# Patient Record
Sex: Female | Born: 1935 | Race: White | Hispanic: No
Health system: Southern US, Community
[De-identification: ages and names within clinical notes are randomized; demographics above are authoritative.]

## PROBLEM LIST (undated history)

## (undated) DIAGNOSIS — E039 Hypothyroidism, unspecified: Secondary | ICD-10-CM

## (undated) DIAGNOSIS — E559 Vitamin D deficiency, unspecified: Secondary | ICD-10-CM

## (undated) DIAGNOSIS — I10 Essential (primary) hypertension: Secondary | ICD-10-CM

## (undated) DIAGNOSIS — F419 Anxiety disorder, unspecified: Secondary | ICD-10-CM

## (undated) DIAGNOSIS — M810 Age-related osteoporosis without current pathological fracture: Secondary | ICD-10-CM

## (undated) DIAGNOSIS — F0390 Unspecified dementia without behavioral disturbance: Secondary | ICD-10-CM

---

## 2018-01-30 ENCOUNTER — Emergency Department (HOSPITAL_COMMUNITY): Payer: Medicare Other

## 2018-01-30 ENCOUNTER — Emergency Department (HOSPITAL_COMMUNITY)
Admission: EM | Admit: 2018-01-30 | Discharge: 2018-01-31 | Disposition: A | Discharge status: Skilled Nursing Facility | Payer: Medicare Other | Attending: Emergency Medicine | Admitting: Emergency Medicine

## 2018-01-30 ENCOUNTER — Other Ambulatory Visit: Payer: Self-pay

## 2018-01-30 ENCOUNTER — Encounter (HOSPITAL_COMMUNITY): Payer: Self-pay

## 2018-01-30 DIAGNOSIS — R112 Nausea with vomiting, unspecified: Secondary | ICD-10-CM | POA: Insufficient documentation

## 2018-01-30 DIAGNOSIS — K5900 Constipation, unspecified: Secondary | ICD-10-CM | POA: Insufficient documentation

## 2018-01-30 DIAGNOSIS — K449 Diaphragmatic hernia without obstruction or gangrene: Secondary | ICD-10-CM | POA: Diagnosis not present

## 2018-01-30 DIAGNOSIS — R1013 Epigastric pain: Secondary | ICD-10-CM | POA: Diagnosis present

## 2018-01-30 DIAGNOSIS — I10 Essential (primary) hypertension: Secondary | ICD-10-CM | POA: Diagnosis not present

## 2018-01-30 DIAGNOSIS — Z79899 Other long term (current) drug therapy: Secondary | ICD-10-CM | POA: Insufficient documentation

## 2018-01-30 DIAGNOSIS — F0390 Unspecified dementia without behavioral disturbance: Secondary | ICD-10-CM | POA: Insufficient documentation

## 2018-01-30 DIAGNOSIS — K5641 Fecal impaction: Secondary | ICD-10-CM | POA: Insufficient documentation

## 2018-01-30 DIAGNOSIS — E039 Hypothyroidism, unspecified: Secondary | ICD-10-CM | POA: Diagnosis not present

## 2018-01-30 HISTORY — DX: Vitamin D deficiency, unspecified: E55.9

## 2018-01-30 HISTORY — DX: Hypothyroidism, unspecified: E03.9

## 2018-01-30 HISTORY — DX: Essential (primary) hypertension: I10

## 2018-01-30 HISTORY — DX: Unspecified dementia, unspecified severity, without behavioral disturbance, psychotic disturbance, mood disturbance, and anxiety: F03.90

## 2018-01-30 HISTORY — DX: Age-related osteoporosis without current pathological fracture: M81.0

## 2018-01-30 HISTORY — DX: Anxiety disorder, unspecified: F41.9

## 2018-01-30 LAB — LIPASE, BLOOD: LIPASE: 54 U/L — AB (ref 11–51)

## 2018-01-30 LAB — URINALYSIS, ROUTINE W REFLEX MICROSCOPIC
Bilirubin Urine: NEGATIVE
GLUCOSE, UA: NEGATIVE mg/dL
Hgb urine dipstick: NEGATIVE
KETONES UR: NEGATIVE mg/dL
Leukocytes, UA: NEGATIVE
Nitrite: NEGATIVE
PH: 6 (ref 5.0–8.0)
Protein, ur: NEGATIVE mg/dL
SPECIFIC GRAVITY, URINE: 1.019 (ref 1.005–1.030)

## 2018-01-30 LAB — COMPREHENSIVE METABOLIC PANEL
ALBUMIN: 3.5 g/dL (ref 3.5–5.0)
ALT: 92 U/L — ABNORMAL HIGH (ref 14–54)
AST: 172 U/L — AB (ref 15–41)
Alkaline Phosphatase: 115 U/L (ref 38–126)
Anion gap: 12 (ref 5–15)
BUN: 22 mg/dL — AB (ref 6–20)
CHLORIDE: 105 mmol/L (ref 101–111)
CO2: 27 mmol/L (ref 22–32)
Calcium: 9.2 mg/dL (ref 8.9–10.3)
Creatinine, Ser: 1.2 mg/dL — ABNORMAL HIGH (ref 0.44–1.00)
GFR calc Af Amer: 46 mL/min — ABNORMAL LOW (ref >60)
GFR calc non Af Amer: 39 mL/min — ABNORMAL LOW (ref >60)
GLUCOSE: 122 mg/dL — AB (ref 65–99)
POTASSIUM: 3.8 mmol/L (ref 3.5–5.1)
SODIUM: 144 mmol/L (ref 135–145)
Total Bilirubin: 0.6 mg/dL (ref 0.3–1.2)
Total Protein: 6.5 g/dL (ref 6.5–8.1)

## 2018-01-30 LAB — CBC
HEMATOCRIT: 40.3 % (ref 36.0–46.0)
HEMOGLOBIN: 12.8 g/dL (ref 12.0–15.0)
MCH: 29.5 pg (ref 26.0–34.0)
MCHC: 31.8 g/dL (ref 30.0–36.0)
MCV: 92.9 fL (ref 78.0–100.0)
Platelets: 303 10*3/uL (ref 150–400)
RBC: 4.34 MIL/uL (ref 3.87–5.11)
RDW: 15.1 % (ref 11.5–15.5)
WBC: 5.8 10*3/uL (ref 4.0–10.5)

## 2018-01-30 LAB — I-STAT CG4 LACTIC ACID, ED: Lactic Acid, Venous: 1.65 mmol/L (ref 0.5–1.9)

## 2018-01-30 MED ORDER — ONDANSETRON HCL 4 MG/2ML IJ SOLN
4.0000 mg | Freq: Once | INTRAMUSCULAR | Status: AC
  Administered 2018-01-30: 4 mg via INTRAVENOUS
  Filled 2018-01-30: qty 2

## 2018-01-30 MED ORDER — SODIUM CHLORIDE 0.9 % IV SOLN
INTRAVENOUS | Status: DC
  Administered 2018-01-30: 20:00:00 via INTRAVENOUS

## 2018-01-30 MED ORDER — HYDROMORPHONE HCL 1 MG/ML IJ SOLN
0.2500 mg | INTRAMUSCULAR | Status: DC | PRN
  Administered 2018-01-30: 0.25 mg via INTRAVENOUS
  Filled 2018-01-30: qty 1

## 2018-01-30 MED ORDER — POLYETHYLENE GLYCOL 3350 17 G PO PACK: 17.0000 g | PACK | Freq: Every day | ORAL | 0 refills | Status: AC

## 2018-01-30 MED ORDER — ONDANSETRON HCL 4 MG PO TABS: 4.0000 mg | ORAL_TABLET | Freq: Three times a day (TID) | ORAL | 0 refills | Status: AC | PRN

## 2018-01-30 MED ORDER — SODIUM CHLORIDE 0.9 % IV BOLUS (SEPSIS)
500.0000 mL | Freq: Once | INTRAVENOUS | Status: AC
  Administered 2018-01-30: 500 mL via INTRAVENOUS

## 2018-01-30 MED ORDER — DOCUSATE SODIUM 100 MG PO CAPS: 100.0000 mg | ORAL_CAPSULE | Freq: Two times a day (BID) | ORAL | 0 refills | Status: AC

## 2018-01-30 NOTE — Discharge Instructions (Signed)
Take the antinausea medications as needed, take the laxative medications to help with the constipation, return to the emergency room as needed for vomiting, worsening symptoms

## 2018-01-30 NOTE — ED Triage Notes (Signed)
Pt arrived from Memory Care unit via [facility]. Pt began complaining of Epigastric pain around 1800 today. Pt is AOx1. Pt is ambulatory. Pt has been spitting about every 8-10 minutes. Clear mucous.

## 2018-01-30 NOTE — ED Provider Notes (Signed)
[facility]-EMERGENCY DEPT Provider Note   CSN: [id number] Arrival date & time: 01/30/18  1849    Level 5 caveat: Dementia History   Chief Complaint Chief Complaint  Patient presents with  . Abdominal Pain    Epigastric Pain    HPI Ether GriffinsHelen Merriwether is a 84 y.o. female.  HPI Patient presents to the emergency room for evaluation of nausea vomiting and abdominal pain.  Patient is a resident of a nursing facility.  She resides in a memory care unit.  Patient began complaining of upper abdominal pain today at about 6 PM.  She started having episodes of nausea and spitting up.  EMS was called and the patient was brought to the emergency room.  She was given a dose of Zofran on route.  It is difficult to get any clear history from the patient.  She states she is hurting all over.  She is asking [facility] for help. Past Medical History:  Diagnosis Date  . Anxiety   . Dementia   . Essential (primary) hypertension   . Hypothyroid   . Hypothyroidism   . Osteoporosis   . Vitamin D deficiency     There are no active problems to display for this patient.   History reviewed. No pertinent surgical history.  OB History    No data available       Home Medications    Prior to Admission medications   Medication Sig Start Date End Date Taking? Authorizing Provider  aspirin EC 81 MG tablet Take 81 mg by mouth daily.   Yes [provider]  cholecalciferol (VITAMIN D) 1000 units tablet Take 2,000 Units by mouth daily.   Yes [provider]  guaifenesin (ROBITUSSIN) 100 MG/5ML syrup Take 100 mg by mouth 3 (three) times daily as needed for cough.   Yes [provider]  levothyroxine (SYNTHROID, LEVOTHROID) 25 MCG tablet Take 25 mcg by mouth daily before breakfast.   Yes [provider]  Melatonin 10 MG TABS Take 10 mg by mouth at bedtime.   Yes [provider]  Multiple Vitamin (MULTIVITAMIN WITH MINERALS) TABS tablet Take 1 tablet  by mouth daily.   Yes [provider]  potassium chloride SA (K-DUR,KLOR-CON) 20 MEQ tablet Take 20 mEq by mouth daily.   Yes [provider]  ranitidine (ZANTAC) 150 MG tablet Take 150 mg by mouth daily.   Yes [provider]  risperiDONE (RISPERDAL) 1 MG tablet Take 1 mg by mouth at bedtime.   Yes [provider]  sertraline (ZOLOFT) 25 MG tablet Take 25 mg by mouth daily.   Yes [provider]  docusate sodium (COLACE) 100 MG capsule Take 1 capsule (100 mg total) by mouth every 12 (twelve) hours. 01/30/18   Linwood DibblesKnapp, Bryant Lipps, MD  ondansetron (ZOFRAN) 4 MG tablet Take 1 tablet (4 mg total) by mouth every 8 (eight) hours as needed for nausea or vomiting. 01/30/18   Linwood DibblesKnapp, Cordney Barstow, MD  polyethylene glycol [facility](MIRALAX) packet Take 17 g by mouth daily. 01/30/18   Linwood DibblesKnapp, Geordan Xu, MD    Family History No family history on file.  Social History Social History   Tobacco Use  . Smoking status: Unknown If Ever Smoked  Substance Use Topics  . Alcohol use: No    Frequency: Never  . Drug use: No     Allergies   Ciprofloxacin; Iodine; and Penicillins   Review of Systems Review of Systems  All other systems reviewed and are negative.  Physical Exam Updated Vital Signs BP (!) 169/99   Pulse 95   Resp (!) 23   Ht 1.626 m (5\' 4" )   Wt 59 kg (130 lb)   SpO2 98%   BMI 22.31 kg/m   Physical Exam  Constitutional: No distress.  Elderly, frail  HENT:  Head: Normocephalic and atraumatic.  Right Ear: External ear normal.  Left Ear: External ear normal.  Eyes: Conjunctivae are normal. Right eye exhibits no discharge. Left eye exhibits no discharge. No scleral icterus.  Neck: Neck supple. No tracheal deviation present.  Cardiovascular: Normal rate, regular rhythm and intact distal pulses.  Pulmonary/Chest: Effort normal and breath sounds normal. No stridor. No respiratory distress. She has no wheezes. She has no rales.  Abdominal: Soft. Bowel sounds are  normal. She exhibits no distension, no pulsatile midline mass and no mass. There is tenderness in the epigastric area. There is no rigidity, no rebound and no guarding. No hernia.  Musculoskeletal: She exhibits no edema or tenderness.  Neurological: She is alert. She has normal strength. No cranial nerve deficit (no facial droop, extraocular movements intact, no slurred speech) or sensory deficit. She exhibits normal muscle tone. She displays no seizure activity. Coordination normal.  Skin: Skin is warm and dry. No rash noted.  Psychiatric: She has a normal mood and affect.  Nursing note and vitals reviewed.    ED Treatments / Results  Labs (all labs ordered are listed, but only abnormal results are displayed) Labs Reviewed  LIPASE, BLOOD - Abnormal; Notable for the following components:      Result Value   Lipase 54 (*)    All other components within normal limits  COMPREHENSIVE METABOLIC PANEL - Abnormal; Notable for the following components:   Glucose, Bld 122 (*)    BUN 22 (*)    Creatinine, Ser 1.20 (*)    AST 172 (*)    ALT 92 (*)    GFR calc non Af Amer 39 (*)    GFR calc Af Amer 46 (*)    All other components within normal limits  CBC  URINALYSIS, ROUTINE W REFLEX MICROSCOPIC  I-STAT CG4 LACTIC ACID, ED    EKG  EKG Interpretation  Date/Time:  Wednesday January 30 2018 19:24:40 EST Ventricular Rate:  81 PR Interval:    QRS Duration: 66 QT Interval:  392 QTC Calculation: 455 R Axis:   62 Text Interpretation:  Sinus rhythm Abnormal R-wave progression, early transition No significant change since last tracing Confirmed by Linwood Dibbles [phone]) on 01/30/2018 7:37:00 PM       Radiology Ct Abdomen Pelvis Wo Contrast  Result Date: 01/30/2018 CLINICAL DATA:  Epigastric pain since 1800 hours today. EXAM: CT ABDOMEN AND PELVIS WITHOUT CONTRAST TECHNIQUE: Multidetector CT imaging of the abdomen and pelvis was performed following the standard protocol without IV contrast.  COMPARISON:  None. FINDINGS: Lower chest: Fluid and contrast distention of the distal esophagus likely related to reflux or delayed esophageal emptying. There appears to be a paraesophageal hernia containing much of the stomach in the left lower thorax with normal position of gastroesophageal junction (series 5, image 44. Small bilateral pleural effusions right greater than left. Aortic atherosclerosis of the thoracic aorta. Coronary arteriosclerosis along the left main, lad and circumflex. Hepatobiliary: Cholecystectomy.  No acute hepatic abnormality. Pancreas: Normal Spleen: Normal Adrenals/Urinary Tract: Unremarkable bilateral adrenal glands. Subtle hypodensity in the lower poles of both kidneys may reflect small cyst but cannot be further characterized in the absence of IV contrast.  No nephrolithiasis. Nondistended slightly thick-walled urinary bladder likely due to underdistention. Correlate to exclude cystitis. Stomach/Bowel: The stomach is distended with ingested food and contrast. No small bowel dilatation or inflammation. Normal bowel rotation. No evidence of appendicitis. Large volume of stool in the rectosigmoid overall mural thickening compatible with fecal impaction and possible stercoral proctocolitis. Vascular/Lymphatic: Moderate aortoiliac and branch vessel atherosclerosis without aneurysm. No lymphadenopathy Reproductive: Status post hysterectomy. No adnexal masses. Other: No abdominopelvic ascites. Musculoskeletal: Thoracolumbar spondylosis with degenerative disc disease and vacuum disc phenomenon most pronounced at L1-2 and L4-5. Lower lumbar facet arthropathy is seen. No listhesis. IMPRESSION: 1. Herniation of the much of the stomach into the left hemithorax with normal position to gastroesophageal junction. Findings are in keeping with a large left parasagittal hernia. Fluid and contrast is noted in the distal esophagus at the from reflux or delayed esophageal emptying. 2. Small bilateral  pleural effusions. 3. Coronary arteriosclerosis and thoracic aortic atherosclerosis. 4. Large amount of retained stool in the rectosigmoid concerning for fecal impaction and possible stercoral proctocolitis. 5. Thoracolumbar spondylosis and Electronically Signed   By: Tollie Eth M.D.   On: 01/30/2018 23:34   Dg Chest Portable 1 View  Result Date: 01/30/2018 CLINICAL DATA:  84 year old female with history of abdominal pain and vomiting. EXAM: PORTABLE CHEST 1 VIEW COMPARISON:  No priors. FINDINGS: Marked elevation of the left hemidiaphragm, beneath which there is a gas containing structure. This exerts mass effect upon the heart which is largely displaced into the lower right hemithorax. Irregular opacities in lung bases may reflect areas of subsegmental atelectasis. Trace right pleural effusion. No definite acute consolidative airspace disease. No evidence of pulmonary edema. No pneumothorax. Mediastinal contours are distorted by mass effect from the process at the base of the left hemithorax. Aortic atherosclerosis. IMPRESSION: 1. Marked elevation of left hemidiaphragm, beneath which is a gas containing structure. This is likely to represent a very dilated gas-filled stomach, but could alternatively be dilated gas-filled transverse colon. Further evaluation with CT the abdomen and pelvis could provide additional information if clinically appropriate. 2. Trace right pleural effusion. 3. Aortic atherosclerosis. Electronically Signed   By: Trudie Reed M.D.   On: 01/30/2018 19:23    Procedures Procedures (including critical care time)  Medications Ordered in ED Medications  sodium chloride 0.9 % bolus 500 mL (0 mLs Intravenous Stopped 01/30/18 2045)    And  0.9 %  sodium chloride infusion ( Intravenous New Bag/Given 01/30/18 2020)  HYDROmorphone (DILAUDID) injection 0.25 mg (0.25 mg Intravenous Given 01/30/18 1951)  ondansetron (ZOFRAN) injection 4 mg (4 mg Intravenous Given 01/30/18 1951)      Initial Impression / Assessment and Plan / ED Course  I have reviewed the triage vital signs and the nursing notes.  Pertinent labs & imaging results that were available during my care of the patient were reviewed by me and considered in my medical decision making (see chart for details).   Patient's laboratory tests are notable for a slight increase in the lipase and her LFTs.  Otherwise her labs are unremarkable.  Because of her age, prior surgery and complaints CT scan was performed.  No evidence of bowel obstruction.  Patient does have a large hiatal hernia. I suspect that was the main cause of her nausea and upper abdominal pain.  Pt's daughters are here and states she has had this trouble in the past.  CT scan shows fecal impaction and possible proctocolitis.  I discussed doing a rectal exam and fecal disimpaction  with her daughters.  Pt won't allow me to do that exam.  Her demential makes it hard for her to understand and comply.  I will dc back to the nursing facility with laxative rx  Final Clinical Impressions(s) / ED Diagnoses   Final diagnoses:  Hiatal hernia  Constipation, unspecified constipation type    ED Discharge Orders        Ordered    docusate sodium (COLACE) 100 MG capsule  Every 12 hours     01/30/18 2357    polyethylene glycol (MIRALAX) packet  Daily     01/30/18 2357    ondansetron (ZOFRAN) 4 MG tablet  Every 8 hours PRN     01/30/18 2357       Linwood Dibbles, MD 01/31/18 0010

## 2018-01-31 NOTE — ED Notes (Signed)
[facility] called for transport.  

## 2018-05-11 ENCOUNTER — Encounter (HOSPITAL_COMMUNITY): Payer: Self-pay

## 2018-05-11 ENCOUNTER — Other Ambulatory Visit: Payer: Self-pay

## 2018-05-11 ENCOUNTER — Emergency Department (HOSPITAL_COMMUNITY): Payer: Medicare Other

## 2018-05-11 ENCOUNTER — Emergency Department (HOSPITAL_COMMUNITY)
Admission: EM | Admit: 2018-05-11 | Discharge: 2018-05-12 | Disposition: A | Discharge status: Skilled Nursing Facility | Payer: Medicare Other | Attending: Emergency Medicine | Admitting: Emergency Medicine

## 2018-05-11 DIAGNOSIS — R05 Cough: Secondary | ICD-10-CM | POA: Diagnosis present

## 2018-05-11 DIAGNOSIS — E039 Hypothyroidism, unspecified: Secondary | ICD-10-CM | POA: Insufficient documentation

## 2018-05-11 DIAGNOSIS — R509 Fever, unspecified: Secondary | ICD-10-CM

## 2018-05-11 DIAGNOSIS — F0390 Unspecified dementia without behavioral disturbance: Secondary | ICD-10-CM | POA: Diagnosis not present

## 2018-05-11 DIAGNOSIS — R1084 Generalized abdominal pain: Secondary | ICD-10-CM | POA: Insufficient documentation

## 2018-05-11 DIAGNOSIS — Y92129 Unspecified place in nursing home as the place of occurrence of the external cause: Secondary | ICD-10-CM | POA: Diagnosis not present

## 2018-05-11 DIAGNOSIS — J0100 Acute maxillary sinusitis, unspecified: Secondary | ICD-10-CM | POA: Insufficient documentation

## 2018-05-11 DIAGNOSIS — Y999 Unspecified external cause status: Secondary | ICD-10-CM | POA: Insufficient documentation

## 2018-05-11 DIAGNOSIS — Z79899 Other long term (current) drug therapy: Secondary | ICD-10-CM | POA: Diagnosis not present

## 2018-05-11 DIAGNOSIS — Y939 Activity, unspecified: Secondary | ICD-10-CM | POA: Diagnosis not present

## 2018-05-11 DIAGNOSIS — I10 Essential (primary) hypertension: Secondary | ICD-10-CM | POA: Diagnosis not present

## 2018-05-11 DIAGNOSIS — W19XXXA Unspecified fall, initial encounter: Secondary | ICD-10-CM | POA: Insufficient documentation

## 2018-05-11 DIAGNOSIS — Z7982 Long term (current) use of aspirin: Secondary | ICD-10-CM | POA: Insufficient documentation

## 2018-05-11 DIAGNOSIS — R059 Cough, unspecified: Secondary | ICD-10-CM

## 2018-05-11 LAB — URINALYSIS, ROUTINE W REFLEX MICROSCOPIC
BILIRUBIN URINE: NEGATIVE
Bacteria, UA: NONE SEEN
GLUCOSE, UA: NEGATIVE mg/dL
Hgb urine dipstick: NEGATIVE
KETONES UR: NEGATIVE mg/dL
NITRITE: NEGATIVE
PH: 7 (ref 5.0–8.0)
Protein, ur: NEGATIVE mg/dL
SPECIFIC GRAVITY, URINE: 1.025 (ref 1.005–1.030)

## 2018-05-11 LAB — CBC WITH DIFFERENTIAL/PLATELET
Basophils Absolute: 0 10*3/uL (ref 0.0–0.1)
Basophils Relative: 0 %
EOS ABS: 0 10*3/uL (ref 0.0–0.7)
Eosinophils Relative: 1 %
HEMATOCRIT: 36.7 % (ref 36.0–46.0)
HEMOGLOBIN: 11.9 g/dL — AB (ref 12.0–15.0)
Lymphocytes Relative: 7 %
Lymphs Abs: 0.6 10*3/uL — ABNORMAL LOW (ref 0.7–4.0)
MCH: 29.5 pg (ref 26.0–34.0)
MCHC: 32.4 g/dL (ref 30.0–36.0)
MCV: 90.8 fL (ref 78.0–100.0)
MONOS PCT: 7 %
Monocytes Absolute: 0.6 10*3/uL (ref 0.1–1.0)
NEUTROS ABS: 7.4 10*3/uL (ref 1.7–7.7)
NEUTROS PCT: 85 %
Platelets: 263 10*3/uL (ref 150–400)
RBC: 4.04 MIL/uL (ref 3.87–5.11)
RDW: 13.7 % (ref 11.5–15.5)
WBC: 8.7 10*3/uL (ref 4.0–10.5)

## 2018-05-11 LAB — COMPREHENSIVE METABOLIC PANEL
ALK PHOS: 96 U/L (ref 38–126)
ALT: 37 U/L (ref 14–54)
ANION GAP: 10 (ref 5–15)
AST: 39 U/L (ref 15–41)
Albumin: 3 g/dL — ABNORMAL LOW (ref 3.5–5.0)
BILIRUBIN TOTAL: 0.2 mg/dL — AB (ref 0.3–1.2)
BUN: 19 mg/dL (ref 6–20)
CALCIUM: 9 mg/dL (ref 8.9–10.3)
CO2: 26 mmol/L (ref 22–32)
Chloride: 106 mmol/L (ref 101–111)
Creatinine, Ser: 0.98 mg/dL (ref 0.44–1.00)
GFR calc Af Amer: 58 mL/min — ABNORMAL LOW (ref >60)
GFR, EST NON AFRICAN AMERICAN: 50 mL/min — AB (ref >60)
Glucose, Bld: 113 mg/dL — ABNORMAL HIGH (ref 65–99)
POTASSIUM: 3.7 mmol/L (ref 3.5–5.1)
Sodium: 142 mmol/L (ref 135–145)
TOTAL PROTEIN: 6.4 g/dL — AB (ref 6.5–8.1)

## 2018-05-11 LAB — I-STAT CG4 LACTIC ACID, ED: Lactic Acid, Venous: 1.29 mmol/L (ref 0.5–1.9)

## 2018-05-11 MED ORDER — DOXYCYCLINE HYCLATE 100 MG PO CAPS: 100.0000 mg | ORAL_CAPSULE | Freq: Two times a day (BID) | ORAL | 0 refills | Status: AC

## 2018-05-11 MED ORDER — DOXYCYCLINE HYCLATE 100 MG PO TABS
100.0000 mg | ORAL_TABLET | Freq: Once | ORAL | Status: DC
  Filled 2018-05-11: qty 1

## 2018-05-11 NOTE — ED Notes (Signed)
Bed: WA06 Expected date:  Expected time:  Means of arrival:  Comments: [facility] 

## 2018-05-11 NOTE — ED Notes (Signed)
Per pt dtr pt has a cough that has been getting worse and pt has complete a series of Z pack today. Pt has rhonchi noted to rt sided lung field . Pt dtr stated d.t recent infection she has had increased weakness and believes this is why pt had a fall today and rarely has falls and does not use assistive devices.

## 2018-05-11 NOTE — Discharge Instructions (Signed)
Please read and follow all provided instructions.  Your diagnoses today include:  1. Acute non-recurrent maxillary sinusitis   2. Fever, unspecified fever cause   3. Fall, initial encounter   4. Cough     Tests performed today include:  Head and neck CT - no broken bones, shows sinus infection  Blood counts and electrolytes  Urine test  Chest x-ray - does not show any definite pneumonia  Vital signs. See below for your results today.   Medications prescribed:   Doxycycline - antibiotic  You have been prescribed an antibiotic medicine: take the entire course of medicine even if you are feeling better. Stopping early can cause the antibiotic not to work.  Take any prescribed medications only as directed.  Home care instructions:  Follow any educational materials contained in this packet.  BE VERY CAREFUL not to take multiple medicines containing Tylenol (also called acetaminophen). Doing so can lead to an overdose which can damage your liver and cause liver failure and possibly death.   Follow-up instructions: Please follow-up with your primary care provider in the next 3 days for further evaluation of your symptoms.   Return instructions:   Please return to the Emergency Department if you experience worsening symptoms.   Return with worsening shortness of breath, chest pain  Please return if you have any other emergent concerns.  Additional Information:  Your vital signs today were: BP (!) 162/65    Pulse 72    Temp (!) 101.1 F (38.4 C) (Axillary)    Resp 18    Ht 4\' 11"  (1.499 m)    Wt 43.1 kg (95 lb)    SpO2 98%    BMI 19.19 kg/m  If your blood pressure (BP) was elevated above 135/85 this visit, please have this repeated by your doctor within one month. --------------

## 2018-05-11 NOTE — ED Notes (Signed)
Patient transported to X-ray 

## 2018-05-11 NOTE — ED Triage Notes (Signed)
[facility] EMS  [facility]. Pt fnd outside of SNF on the ground by staff. Pt was able to ambulate and up with minimal assistance. No obvious or apparent injuries. Pt was fnd by EMS staff sitting on chair. FS also reports that pt is being treated  being treated for SOB and "fluid on the lungs" per facility and has been on ABX. Past 3 days and has had  fever . Pt at this time appears without distress and is resting calmly no apparent respiratory distress and is able to talk in complete  sentences.   Pt has HX of  Dementia and syncope.   EMS v/s 152/64 HR 88, RR 20, 98% Temp 97.7

## 2018-05-11 NOTE — ED Provider Notes (Signed)
[facility]-EMERGENCY DEPT Provider Note   CSN: [id number] Arrival date & time: 05/11/18  1756     History   Chief Complaint Chief Complaint  Patient presents with  . Cough    HPI Kimberly Wu is a 84 y.o. female.  Patient with history of dementia presents from [facility] skilled nursing facility with complaint of fever.  Patient has had a cough for the past 3 days.  She was seen by doctors there and was placed on antibiotics which are now complete.  Patient has had on and off fevers for the past several days.  Today she was found after falling outside.  This occurred just prior to arrival.  EMS was called.  Level 5 caveat due to dementia.     Past Medical History:  Diagnosis Date  . Anxiety   . Dementia   . Essential (primary) hypertension   . Hypothyroid   . Hypothyroidism   . Osteoporosis   . Vitamin D deficiency     There are no active problems to display for this patient.   No past surgical history on file.   OB History   None      Home Medications    Prior to Admission medications   Medication Sig Start Date End Date Taking? Authorizing Provider  aspirin EC 81 MG tablet Take 81 mg by mouth daily.    [provider]  cholecalciferol (VITAMIN D) 1000 units tablet Take 2,000 Units by mouth daily.    [provider]  docusate sodium (COLACE) 100 MG capsule Take 1 capsule (100 mg total) by mouth every 12 (twelve) hours. 01/30/18   Linwood Dibbles, MD  guaifenesin (ROBITUSSIN) 100 MG/5ML syrup Take 100 mg by mouth 3 (three) times daily as needed for cough.    [provider]  levothyroxine (SYNTHROID, LEVOTHROID) 25 MCG tablet Take 25 mcg by mouth daily before breakfast.    [provider]  Melatonin 10 MG TABS Take 10 mg by mouth at bedtime.    [provider]  Multiple Vitamin (MULTIVITAMIN WITH MINERALS) TABS tablet Take 1 tablet by mouth daily.    [provider]  ondansetron (ZOFRAN) 4 MG  tablet Take 1 tablet (4 mg total) by mouth every 8 (eight) hours as needed for nausea or vomiting. 01/30/18   Linwood Dibbles, MD  polyethylene glycol [facility]) packet Take 17 g by mouth daily. 01/30/18   Linwood Dibbles, MD  potassium chloride SA (K-DUR,KLOR-CON) 20 MEQ tablet Take 20 mEq by mouth daily.    [provider]  ranitidine (ZANTAC) 150 MG tablet Take 150 mg by mouth daily.    [provider]  risperiDONE (RISPERDAL) 1 MG tablet Take 1 mg by mouth at bedtime.    [provider]  sertraline (ZOLOFT) 25 MG tablet Take 25 mg by mouth daily.    [provider]    Family History History reviewed. No pertinent family history.  Social History Social History   Tobacco Use  . Smoking status: Unknown If Ever Smoked  . Smokeless tobacco: Never Used  Substance Use Topics  . Alcohol use: No    Frequency: Never  . Drug use: No     Allergies   Ciprofloxacin; Iodine; and Penicillins   Review of Systems Review of Systems  Unable to perform ROS: Dementia     Physical Exam Updated Vital Signs Ht 4\' 11"  (1.499 m)   Wt 43.1 kg (95 lb)   BMI 19.19 kg/m   Physical  Exam  Constitutional: She appears well-developed and well-nourished.  HENT:  Head: Normocephalic and atraumatic.  Mouth/Throat: Oropharynx is clear and moist.  Eyes: Conjunctivae are normal. Right eye exhibits no discharge. Left eye exhibits no discharge.  Neck: Normal range of motion. Neck supple.  Cardiovascular: Normal rate and regular rhythm.  No murmur heard. Pulmonary/Chest: Effort normal and breath sounds normal. No respiratory distress. She has no wheezes. She has no rales.  Abdominal: Soft. There is tenderness (Generalized). There is no rebound and no guarding.  Musculoskeletal: She exhibits no edema or tenderness.  Neurological: She is alert.  Skin: Skin is warm and dry.  Warm to touch, consistent with fever  Psychiatric: She has a normal mood and affect.  Nursing note and  vitals reviewed.    ED Treatments / Results  Labs (all labs ordered are listed, but only abnormal results are displayed) Labs Reviewed  CBC WITH DIFFERENTIAL/PLATELET - Abnormal; Notable for the following components:      Result Value   Hemoglobin 11.9 (*)    Lymphs Abs 0.6 (*)    All other components within normal limits  URINALYSIS, ROUTINE W REFLEX MICROSCOPIC - Abnormal; Notable for the following components:   APPearance CLOUDY (*)    Leukocytes, UA TRACE (*)    All other components within normal limits  COMPREHENSIVE METABOLIC PANEL - Abnormal; Notable for the following components:   Glucose, Bld 113 (*)    Total Protein 6.4 (*)    Albumin 3.0 (*)    Total Bilirubin 0.2 (*)    GFR calc non Af Amer 50 (*)    GFR calc Af Amer 58 (*)    All other components within normal limits  CULTURE, BLOOD (ROUTINE X 2)  CULTURE, BLOOD (ROUTINE X 2)  I-STAT CG4 LACTIC ACID, ED    EKG EKG Interpretation  Date/Time:  Saturday May 11 2018 19:28:59 EDT Ventricular Rate:  73 PR Interval:    QRS Duration: 78 QT Interval:  394 QTC Calculation: 435 R Axis:   66 Text Interpretation:  Sinus rhythm Nonspecific ST abnormality Confirmed by Cathren Laine ([id number]) on 05/11/2018 7:33:06 PM   Radiology Dg Chest 2 View  Result Date: 05/11/2018 CLINICAL DATA:  Cough for several days EXAM: CHEST - 2 VIEW COMPARISON:  01/30/2018 FINDINGS: Cardiac shadow is mildly enlarged but stable. Elevation of left hemidiaphragm is again seen. Mild right basilar atelectasis is noted. Mild vascular congestion is seen. No bony abnormality is noted. IMPRESSION: Mild vascular congestion and right basilar atelectasis. Electronically Signed   By: Alcide Clever M.D.   On: 05/11/2018 21:01   Ct Head Wo Contrast  Result Date: 05/11/2018 CLINICAL DATA:  84 year old nursing home patient who was found on the ground but responsive at the nursing home earlier today. EXAM: CT HEAD WITHOUT CONTRAST CT CERVICAL SPINE WITHOUT CONTRAST  TECHNIQUE: Multidetector CT imaging of the head and cervical spine was performed following the standard protocol without intravenous contrast. Multiplanar CT image reconstructions of the cervical spine were also generated. COMPARISON:  None. FINDINGS: CT HEAD FINDINGS Brain: Severe cortical and deep atrophy and mild cerebellar atrophy. Severe changes of small vessel disease of the white matter diffusely. No mass lesion. No midline shift. No acute hemorrhage or hematoma. No extra-axial fluid collections. No evidence of acute infarction. Vascular: Severe BILATERAL carotid siphon and vertebral artery atherosclerosis. Skull: No skull fracture or other focal osseous abnormality involving the skull. Sinuses/Orbits: Opacification of BILATERAL ethmoid air cells with scattered air-fluid levels. Air-fluid levels in the  maxillary sinuses. Frontal sinuses and sphenoid sinuses well aerated. BILATERAL mastoid air cells and BILATERAL middle ear cavities well-aerated. Visualized orbits and globes normal in appearance. Other: None. CT CERVICAL SPINE FINDINGS Alignment: Anatomic POSTERIOR alignment. Straightening of the usual lordosis. Skull base and vertebrae: No fractures identified involving the cervical spine. Coronal reformatted images demonstrate an intact craniocervical junction, intact dens and intact lateral masses throughout. Soft tissues and spinal canal: No evidence of paraspinous or spinal canal hematoma. No evidence of spinal stenosis. Disc levels: Moderate disc space narrowing at C5-6 and C6-7. Mild disc space narrowing at C4-5. No visible disc protrusions. Predominantly facet hypertrophy accounts for multilevel foraminal stenoses including mild LEFT C2-3, severe LEFT C3-4, moderate RIGHT C4-5, severe RIGHT C5-6 and severe BILATERAL C6-7. Upper chest: Visualized lung apices clear. Visualized superior mediastinum normal. Other: Atrophic thyroid gland with an approximate 1.0 cm nodule in the LEFT lobe. BILATERAL cervical  carotid atherosclerosis. IMPRESSION: CT Head: 1. No acute intracranial abnormality. 2. Severe generalized atrophy and severe chronic microvascular ischemic changes of the white matter. 3. Acute BILATERAL ethmoid and BILATERAL maxillary sinus disease. CT Cervical Spine: 1. No cervical spine fracture is identified. 2. Degenerative disc disease at C4-5, C5-6 and C6-7. Multilevel foraminal stenoses as detailed above. 3. Atrophic thyroid gland with an approximate 1.0 cm LEFT lobe thyroid nodule. This nodule does not require further imaging follow-up. This recommendation follows consensus guidelines. Electronically Signed   By: Hulan Saashomas  Lawrence M.D.   On: 05/11/2018 19:36   Ct Cervical Spine Wo Contrast  Result Date: 05/11/2018 CLINICAL DATA:  84 year old nursing home patient who was found on the ground but responsive at the nursing home earlier today. EXAM: CT HEAD WITHOUT CONTRAST CT CERVICAL SPINE WITHOUT CONTRAST TECHNIQUE: Multidetector CT imaging of the head and cervical spine was performed following the standard protocol without intravenous contrast. Multiplanar CT image reconstructions of the cervical spine were also generated. COMPARISON:  None. FINDINGS: CT HEAD FINDINGS Brain: Severe cortical and deep atrophy and mild cerebellar atrophy. Severe changes of small vessel disease of the white matter diffusely. No mass lesion. No midline shift. No acute hemorrhage or hematoma. No extra-axial fluid collections. No evidence of acute infarction. Vascular: Severe BILATERAL carotid siphon and vertebral artery atherosclerosis. Skull: No skull fracture or other focal osseous abnormality involving the skull. Sinuses/Orbits: Opacification of BILATERAL ethmoid air cells with scattered air-fluid levels. Air-fluid levels in the maxillary sinuses. Frontal sinuses and sphenoid sinuses well aerated. BILATERAL mastoid air cells and BILATERAL middle ear cavities well-aerated. Visualized orbits and globes normal in appearance.  Other: None. CT CERVICAL SPINE FINDINGS Alignment: Anatomic POSTERIOR alignment. Straightening of the usual lordosis. Skull base and vertebrae: No fractures identified involving the cervical spine. Coronal reformatted images demonstrate an intact craniocervical junction, intact dens and intact lateral masses throughout. Soft tissues and spinal canal: No evidence of paraspinous or spinal canal hematoma. No evidence of spinal stenosis. Disc levels: Moderate disc space narrowing at C5-6 and C6-7. Mild disc space narrowing at C4-5. No visible disc protrusions. Predominantly facet hypertrophy accounts for multilevel foraminal stenoses including mild LEFT C2-3, severe LEFT C3-4, moderate RIGHT C4-5, severe RIGHT C5-6 and severe BILATERAL C6-7. Upper chest: Visualized lung apices clear. Visualized superior mediastinum normal. Other: Atrophic thyroid gland with an approximate 1.0 cm nodule in the LEFT lobe. BILATERAL cervical carotid atherosclerosis. IMPRESSION: CT Head: 1. No acute intracranial abnormality. 2. Severe generalized atrophy and severe chronic microvascular ischemic changes of the white matter. 3. Acute BILATERAL ethmoid and BILATERAL maxillary sinus  disease. CT Cervical Spine: 1. No cervical spine fracture is identified. 2. Degenerative disc disease at C4-5, C5-6 and C6-7. Multilevel foraminal stenoses as detailed above. 3. Atrophic thyroid gland with an approximate 1.0 cm LEFT lobe thyroid nodule. This nodule does not require further imaging follow-up. This recommendation follows consensus guidelines. Electronically Signed   By: Hulan Saas M.D.   On: 05/11/2018 19:36    Procedures Procedures (including critical care time)  Medications Ordered in ED Medications  doxycycline (VIBRA-TABS) tablet 100 mg (100 mg Oral Not Given 05/11/18 2352)     Initial Impression / Assessment and Plan / ED Course  I have reviewed the triage vital signs and the nursing notes.  Pertinent labs & imaging results  that were available during my care of the patient were reviewed by me and considered in my medical decision making (see chart for details).     Patient seen and examined in hallway. Work-up initiated. Medications ordered. Low concern for head or neck injury however given fall and patient's history of dementia, will image head and neck.  Vital signs reviewed and are as follows: BP (!) 162/65   Pulse 72   Temp 98 F (36.7 C) (Axillary)   Resp 18   Ht 4\' 11"  (1.499 m)   Wt 43.1 kg (95 lb)   SpO2 98%   BMI 19.19 kg/m   Patient discussed with and seen by Dr. Denton Lank.   Lab work-up is reassuring.  Normal lactate.  Normal white count.  Blood cultures drawn and pending.  Chest x-ray does not demonstrate pneumonia.  Patient ordered doxycycline for sinusitis demonstrated on CT imaging.  Urine returned.  Patient does not have urinary symptoms and urine does not show overwhelming infection.  Suspect that given cough, upper respiratory tract infection/sinusitis is most likely.  Family and patient updated on plan.  Will discharge back to her facility.  Encourage PCP follow-up for recheck in the next week.  Return with worsening symptoms, persistent fevers, vomiting, new symptoms or other concerns.  Final Clinical Impressions(s) / ED Diagnoses   Final diagnoses:  Acute non-recurrent maxillary sinusitis  Fever, unspecified fever cause  Fall, initial encounter  Cough   Patient with fever and fall today.  She currently resides in a memory care facility.  Work-up is significant for acute sinusitis.  Patient has cough but chest x-ray is clear.  There are some white cells in the urine but no bacteria and no nitrites.  I have low suspicion for UTI at this time.  Normal white blood cell count with normal lactic acid.  Patient appears well, nontoxic.  She is at her mental baseline.  We will discharged home on doxycycline for sinusitis.  Return instructions as above.   ED Discharge Orders        Ordered      doxycycline (VIBRAMYCIN) 100 MG capsule  2 times daily     05/11/18 2325       Renne Crigler, PA-C 05/12/18 0009    Cathren Laine, MD 05/12/18 [phone]

## 2018-05-11 NOTE — ED Notes (Signed)
Bed: [facility] Expected date:  Expected time:  Means of arrival:  Comments: EMS-fall 

## 2018-05-12 NOTE — ED Notes (Signed)
[facility] to transport pt back to nursing home.

## 2018-05-16 LAB — CULTURE, BLOOD (ROUTINE X 2)
CULTURE: NO GROWTH
Culture: NO GROWTH
SPECIAL REQUESTS: ADEQUATE
SPECIAL REQUESTS: ADEQUATE

## 2018-07-07 ENCOUNTER — Emergency Department (HOSPITAL_COMMUNITY): Payer: Medicare Other

## 2018-07-07 ENCOUNTER — Encounter (HOSPITAL_COMMUNITY): Payer: Self-pay | Admitting: *Deleted

## 2018-07-07 ENCOUNTER — Emergency Department (HOSPITAL_COMMUNITY)
Admission: EM | Admit: 2018-07-07 | Discharge: 2018-07-07 | Disposition: A | Discharge status: Skilled Nursing Facility | Payer: Medicare Other | Attending: Emergency Medicine | Admitting: Emergency Medicine

## 2018-07-07 DIAGNOSIS — F0390 Unspecified dementia without behavioral disturbance: Secondary | ICD-10-CM | POA: Diagnosis not present

## 2018-07-07 DIAGNOSIS — Y939 Activity, unspecified: Secondary | ICD-10-CM | POA: Diagnosis not present

## 2018-07-07 DIAGNOSIS — E039 Hypothyroidism, unspecified: Secondary | ICD-10-CM | POA: Diagnosis not present

## 2018-07-07 DIAGNOSIS — Y92126 Garden or yard of nursing home as the place of occurrence of the external cause: Secondary | ICD-10-CM | POA: Insufficient documentation

## 2018-07-07 DIAGNOSIS — W101XXA Fall (on)(from) sidewalk curb, initial encounter: Secondary | ICD-10-CM | POA: Diagnosis not present

## 2018-07-07 DIAGNOSIS — Y999 Unspecified external cause status: Secondary | ICD-10-CM | POA: Diagnosis not present

## 2018-07-07 DIAGNOSIS — M542 Cervicalgia: Secondary | ICD-10-CM | POA: Insufficient documentation

## 2018-07-07 DIAGNOSIS — R51 Headache: Secondary | ICD-10-CM | POA: Diagnosis not present

## 2018-07-07 DIAGNOSIS — Z79899 Other long term (current) drug therapy: Secondary | ICD-10-CM | POA: Diagnosis not present

## 2018-07-07 DIAGNOSIS — Z7982 Long term (current) use of aspirin: Secondary | ICD-10-CM | POA: Insufficient documentation

## 2018-07-07 DIAGNOSIS — I10 Essential (primary) hypertension: Secondary | ICD-10-CM | POA: Insufficient documentation

## 2018-07-07 NOTE — ED Notes (Signed)
Bed: [id number] Expected date: 07/07/18 Expected time: 12:26 PM Means of arrival: Ambulance Comments: Fall, form Memory Care unit

## 2018-07-07 NOTE — ED Notes (Signed)
[facility] called  

## 2018-07-07 NOTE — Discharge Instructions (Addendum)
Please follow-up with your primary care provider regarding your visit today.  Your blood pressure was elevated today, be sure to follow-up with your primary care provider regarding this as well. Please return for any new or worsening symptoms.  Get help right away if: You have: A very bad (severe) headache that is not helped by medicine. Trouble walking or weakness in your arms and legs. Clear or bloody fluid coming from your nose or ears. Changes in your seeing (vision). Jerky movements that you cannot control (seizure). You throw up (vomit). Your symptoms get worse. You lose balance. Your speech is slurred. You pass out. You are sleepier and have trouble staying awake. The black centers of your eyes (pupils) change in size.

## 2018-07-07 NOTE — ED Triage Notes (Signed)
Per EMS, pt from [facility] memory care unit had unwitnessed fall today. Pt was found on atrium floor at ~1130 today. Pt is at baseline mental status according to staff, has hx of alzheimer's. Pt has hematoma to left scalp. Pt states she has some neck pain.

## 2018-07-07 NOTE — ED Provider Notes (Signed)
  Face-to-face evaluation   History: Patient had an unwitnessed fall today was found on floor in the common area.  She lives at a memory care unit.  Daughter is with her at this time.  Physical exam: Patient is alert and responsive she is confused.  Head without palpable or visible injury.  Left hip slightly tender laterally, left leg slightly shortened.  I am able to passively range the left hip and in normal fashion without significant pain.  There is a mobile mass palpable over the left greater trochanter which has the consistency of a lipoma.  There is no bruising overlying this area of swelling.   Medical screening examination/treatment/procedure(s) were conducted as a shared visit with non-physician practitioner(s) and myself.  I personally evaluated the patient during the encounter    Mancel BaleWentz, Minahil Quinlivan, MD 07/10/18 1149

## 2018-07-07 NOTE — ED Provider Notes (Addendum)
[facility]-EMERGENCY DEPT Provider Note   CSN: [id number] Arrival date & time: 07/07/18  1227     History   Chief Complaint Chief Complaint  Patient presents with  . Fall    HPI [name] is a 84 y.o. female presenting via EMS from [facility] for fall approximately 1 hour prior to arrival.  Patient fell in the Courtyard onto uneven cobblestones, this was unwitnessed by staff.  Patient is back to baseline per family at bedside.  Patient endorses left sided headache and neck pain at this time.  Level 5 caveat due to dementia.  HPI  Past Medical History:  Diagnosis Date  . Anxiety   . Dementia   . Essential (primary) hypertension   . Hypothyroid   . Hypothyroidism   . Osteoporosis   . Vitamin D deficiency     There are no active problems to display for this patient.   History reviewed. No pertinent surgical history.   OB History   None      Home Medications    Prior to Admission medications   Medication Sig Start Date End Date Taking? Authorizing Provider  acetaminophen (TYLENOL) 325 MG tablet Take 650 mg by mouth every 6 (six) hours as needed for fever.    [provider]  aspirin EC 81 MG tablet Take 81 mg by mouth daily.    [provider]  Cholecalciferol (VITAMIN D) 2000 units tablet Take 2,000 Units by mouth daily.     [provider]  docusate sodium (COLACE) 100 MG capsule Take 1 capsule (100 mg total) by mouth every 12 (twelve) hours. 01/30/18   Linwood DibblesKnapp, Jon, MD  doxycycline (VIBRAMYCIN) 100 MG capsule Take 1 capsule (100 mg total) by mouth 2 (two) times daily. Patient not taking: Reported on 07/07/2018 05/11/18   Renne CriglerGeiple, Joshua, PA-C  guaifenesin (ROBITUSSIN) 100 MG/5ML syrup Take 100 mg by mouth 3 (three) times daily as needed for cough.    [provider]  hydrocortisone 2.5 % cream Apply topically 2 (two) times daily. Apply to rectal area for hemorrhoids 07/06/18 08/05/18  [provider]  levothyroxine (SYNTHROID, LEVOTHROID) 25 MCG tablet Take 25 mcg by mouth daily before breakfast.    [provider]  loperamide (IMODIUM A-D) 2 MG tablet Take 2 mg by mouth daily as needed for diarrhea or loose stools.    [provider]  LORazepam (ATIVAN) 2 MG/ML concentrated solution 2 mg every 12 (twelve) hours as needed (aggression). Apply to wrist topically    [provider]  Melatonin 1 MG TABS Take 1 mg by mouth at bedtime.     [provider]  ondansetron (ZOFRAN) 4 MG tablet Take 1 tablet (4 mg total) by mouth every 8 (eight) hours as needed for nausea or vomiting. 01/30/18   Linwood DibblesKnapp, Jon, MD  polyethylene glycol [facility](MIRALAX) packet Take 17 g by mouth daily. 01/30/18   Linwood DibblesKnapp, Jon, MD  ranitidine (ZANTAC) 300 MG tablet Take 300 mg by mouth daily.     [provider]  risperiDONE (RISPERDAL) 1 MG tablet Take 0.5 mg by mouth at bedtime.     [provider]  sertraline (ZOLOFT) 25 MG tablet Take 25 mg by mouth daily.    [provider]    Family History No family history on file.  Social History Social History   Tobacco Use  . Smoking status: Unknown If Ever Smoked  . Smokeless tobacco: Never Used  Substance Use Topics  .  Alcohol use: No    Frequency: Never  . Drug use: No     Allergies   Penicillins; Ciprofloxacin; Iodine; and Sulfa antibiotics   Review of Systems Review of Systems  Unable to perform ROS: Dementia     Physical Exam Updated Vital Signs BP (!) 154/80 (BP Location: Right Arm)   Pulse (!) 58   Temp 98.2 F (36.8 C) (Oral)   Resp 18   SpO2 100%   Physical Exam  Constitutional: She is oriented to person, place, and time. She appears well-developed and well-nourished. No distress.  HENT:  Head: Normocephalic and atraumatic. Head is without raccoon's eyes, without Battle's sign, without abrasion and without contusion.  Right Ear: External ear normal.  Left Ear: External ear  normal.  Nose: Nose normal.  Mouth/Throat: Uvula is midline, oropharynx is clear and moist and mucous membranes are normal.  I have not been able to locate a hematoma on the patient's scalp or any other signs of injury, perhaps note from nursing staff is referring to the patient's large seborrheic keratosis on her left scalp.  Eyes: Pupils are equal, round, and reactive to light. EOM are normal.  Neck: Trachea normal, normal range of motion, full passive range of motion without pain and phonation normal. Neck supple. No JVD present. No tracheal tenderness and no spinous process tenderness present. No neck rigidity. No tracheal deviation present.  Cardiovascular: Normal rate, regular rhythm, normal heart sounds and intact distal pulses.  Pulmonary/Chest: Effort normal and breath sounds normal. No respiratory distress. She exhibits no tenderness, no crepitus, no deformity and no swelling.  Abdominal: Soft. Normal appearance and bowel sounds are normal. There is no tenderness. There is no rebound and no guarding.  Musculoskeletal: Normal range of motion.       Left hip: She exhibits normal range of motion and normal strength.       Cervical back: Normal. She exhibits normal range of motion, no tenderness, no bony tenderness, no swelling and no deformity.       Thoracic back: Normal. She exhibits normal range of motion, no tenderness, no bony tenderness, no swelling and no deformity.       Lumbar back: Normal. She exhibits normal range of motion, no tenderness, no bony tenderness, no swelling and no deformity.       Legs: Patient is moving all extremities well and without obvious distress.  Patient is able to sit up in the bed and lift her legs independently, cross and uncrosses her legs without obvious distress.  Neurological: She is alert and oriented to person, place, and time. No sensory deficit.  Skin: Skin is warm and dry. Capillary refill takes less than 2 seconds.  Psychiatric: She has a normal  mood and affect. Her behavior is normal.     ED Treatments / Results  Labs (all labs ordered are listed, but only abnormal results are displayed) Labs Reviewed - No data to display  EKG None  Radiology Ct Head Wo Contrast  Result Date: 07/07/2018 CLINICAL DATA:  Found on floor. Alzheimer's disease with patient at baseline. Left scalp hematoma EXAM: CT HEAD WITHOUT CONTRAST CT CERVICAL SPINE WITHOUT CONTRAST TECHNIQUE: Multidetector CT imaging of the head and cervical spine was performed following the standard protocol without intravenous contrast. Multiplanar CT image reconstructions of the cervical spine were also generated. COMPARISON:  05/28/2018 FINDINGS: CT HEAD FINDINGS Brain: No evidence of acute infarction, hemorrhage, hydrocephalus, extra-axial collection or mass lesion/mass effect. Confluent low-density in the cerebral white matter  from chronic small vessel ischemia. Generalized atrophy. Vascular: Atherosclerotic calcification Skull: Negative for fracture. Possible left parietal scalp swelling. Sinuses/Orbits: Bilateral cataract resection. CT CERVICAL SPINE FINDINGS Alignment: Normal Skull base and vertebrae: Negative for fracture Soft tissues and spinal canal: No prevertebral fluid or swelling. No visible canal hematoma. Disc levels:  Usual degenerative changes Upper chest: No visible injury IMPRESSION: No evidence of acute intracranial or cervical spine injury. Electronically Signed   By: Marnee Spring M.D.   On: 07/07/2018 14:25   Ct Cervical Spine Wo Contrast  Result Date: 07/07/2018 CLINICAL DATA:  Found on floor. Alzheimer's disease with patient at baseline. Left scalp hematoma EXAM: CT HEAD WITHOUT CONTRAST CT CERVICAL SPINE WITHOUT CONTRAST TECHNIQUE: Multidetector CT imaging of the head and cervical spine was performed following the standard protocol without intravenous contrast. Multiplanar CT image reconstructions of the cervical spine were also generated. COMPARISON:   05/28/2018 FINDINGS: CT HEAD FINDINGS Brain: No evidence of acute infarction, hemorrhage, hydrocephalus, extra-axial collection or mass lesion/mass effect. Confluent low-density in the cerebral white matter from chronic small vessel ischemia. Generalized atrophy. Vascular: Atherosclerotic calcification Skull: Negative for fracture. Possible left parietal scalp swelling. Sinuses/Orbits: Bilateral cataract resection. CT CERVICAL SPINE FINDINGS Alignment: Normal Skull base and vertebrae: Negative for fracture Soft tissues and spinal canal: No prevertebral fluid or swelling. No visible canal hematoma. Disc levels:  Usual degenerative changes Upper chest: No visible injury IMPRESSION: No evidence of acute intracranial or cervical spine injury. Electronically Signed   By: Marnee Spring M.D.   On: 07/07/2018 14:25   Dg Hip Unilat W Or Wo Pelvis 2-3 Views Left  Result Date: 07/07/2018 CLINICAL DATA:  Unwitnessed fall today with LEFT hip deformity. EXAM: DG HIP (WITH OR WITHOUT PELVIS) 2-3V LEFT COMPARISON:  None. FINDINGS: No acute fracture, subluxation or dislocation. No focal bony lesions identified. Degenerative changes in the LOWER lumbar spine noted. IMPRESSION: No acute bony abnormality. Electronically Signed   By: Harmon Pier M.D.   On: 07/07/2018 16:41    Procedures Procedures (including critical care time)  Medications Ordered in ED Medications - No data to display   Initial Impression / Assessment and Plan / ED Course  I have reviewed the triage vital signs and the nursing notes.  Pertinent labs & imaging results that were available during my care of the patient were reviewed by me and considered in my medical decision making (see chart for details).    Elderly patient with dementia. Fall at nursing home. At baseline mental status per family member at bedside. No signs of injury on PE. No blood thinners. Negative imaging at this time, CT head and cervical spine negative for acute  abnormalities.  Plain films of left hip were taken due to deformity in that area initially thought to be a dislocation.  Imaging negative for acute bony abnormality.  Evaluated by Dr. Effie Shy, believe that this was due to patient positioning, patient is moving all extremities well, able to cross and across her legs and lift each leg up without obvious sign of distress.  Patient is moving all other extremities well and without obvious signs of distress. Patient appears safe to return back to her skilled nursing facility at this time.  Patient's family is agreeable to plan.  At this time there does not appear to be any evidence of an acute emergency medical condition and the patient appears stable for discharge with appropriate outpatient follow up. Diagnosis was discussed with patient who verbalizes understanding and is agreeable to discharge.  I have discussed return precautions with patient's family who verbalize understanding of return precautions. Patient strongly encouraged to follow-up with their PCP.  Patient's case discussed with Dr. Effie Shy who agrees with plan to discharge with follow-up.   [facility] called by nursing staff to return patient to her facility.  This note was dictated using DragonOne dictation software; please contact for any inconsistencies within the note.    Final Clinical Impressions(s) / ED Diagnoses   Final diagnoses:  Fall (on)(from) sidewalk curb, initial encounter    ED Discharge Orders    None       Elizabeth Palau 07/07/18 1658    Bill Salinas, PA-C 07/07/18 1700    Elizabeth Palau 07/07/18 1701    Mancel Bale, MD 07/10/18 1148

## 2018-10-27 ENCOUNTER — Telehealth (HOSPITAL_BASED_OUTPATIENT_CLINIC_OR_DEPARTMENT_OTHER): Payer: Self-pay | Admitting: Emergency Medicine

## 2019-01-11 DEATH — deceased

## 2019-04-26 IMAGING — CT CT CERVICAL SPINE W/O CM
4 of 7 series · 14 of 33 positions shown, 15 images · non-contrast
Comparison: 05/28/2018

CLINICAL DATA: Found on floor. Alzheimer's disease with patient at
baseline. Left scalp hematoma

EXAM:
CT HEAD WITHOUT CONTRAST
CT CERVICAL SPINE WITHOUT CONTRAST
TECHNIQUE: Multidetector CT imaging of the head and cervical spine was
performed following the standard protocol without intravenous
contrast. Multiplanar CT image reconstructions of the cervical spine
were also generated.

[Series 5: coronal soft tissue · coronal · 0.29mm/px · 3 of 70 slices shown]
[im 18/70  bone]
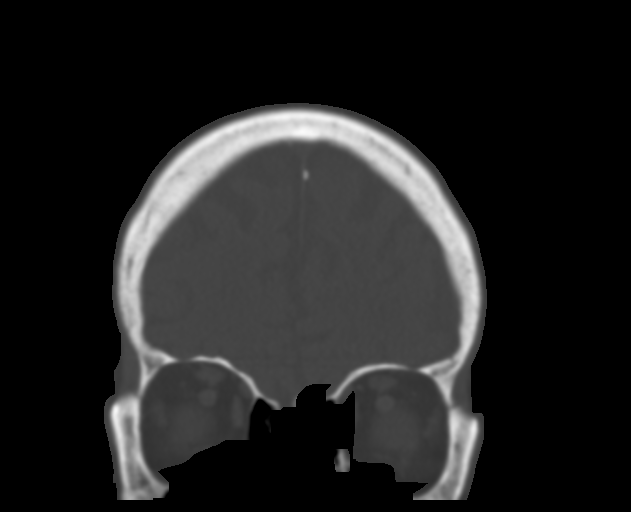
[im 35/70  bone]
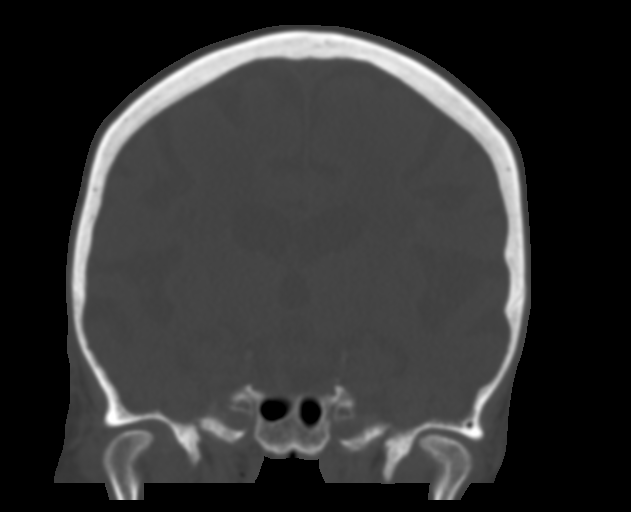
[im 52/70  bone]
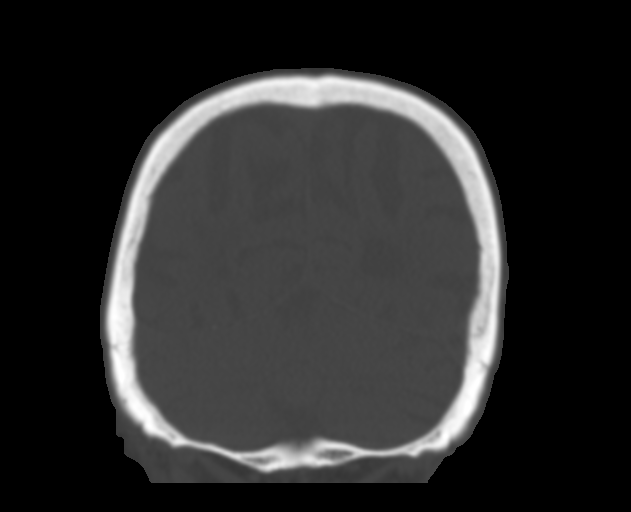

[Series 8: c spine soft · axial · 0.24mm/px · z∈[-232,-198]mm · 2 of 87 slices shown]
[im 18/87  soft-tissue]
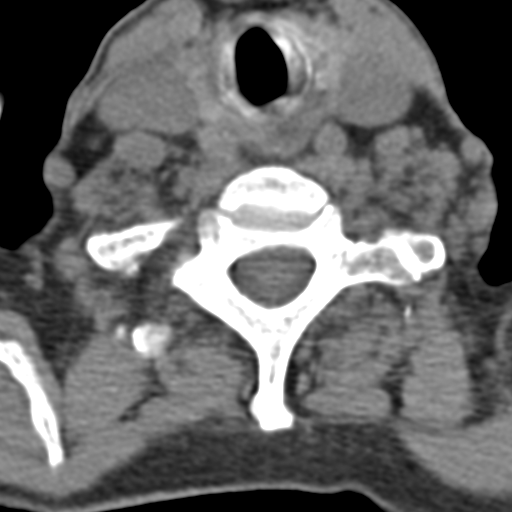
[im 35/87  soft-tissue]
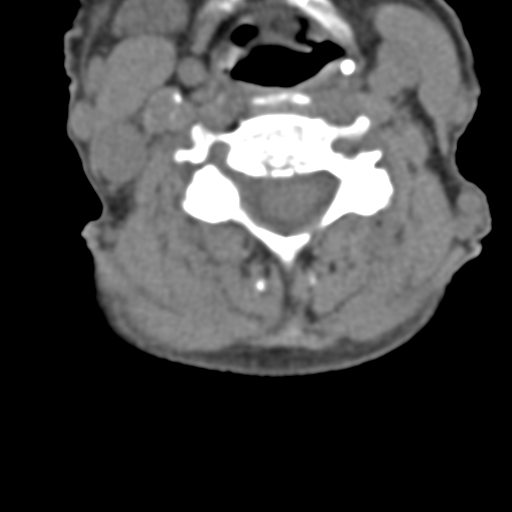

[Series 10: orthogonal bone · axial · 0.18mm/px · z∈[-226,-133]mm · 4 of 84 slices shown, 5 images]
[im 17/84  soft-tissue]
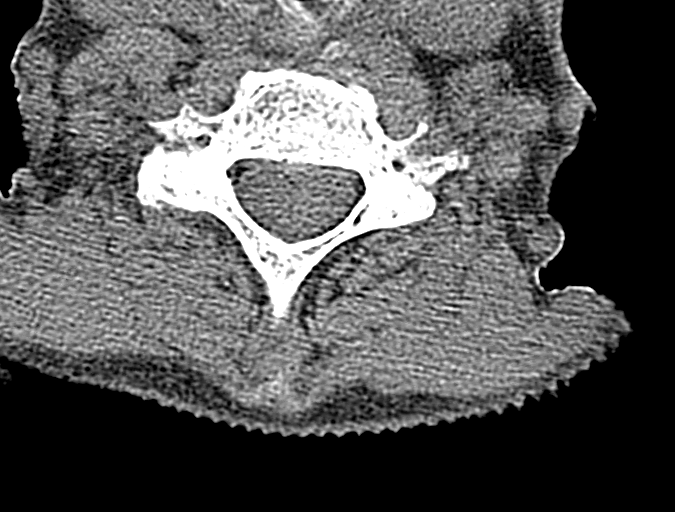
[im 17/84  bone]
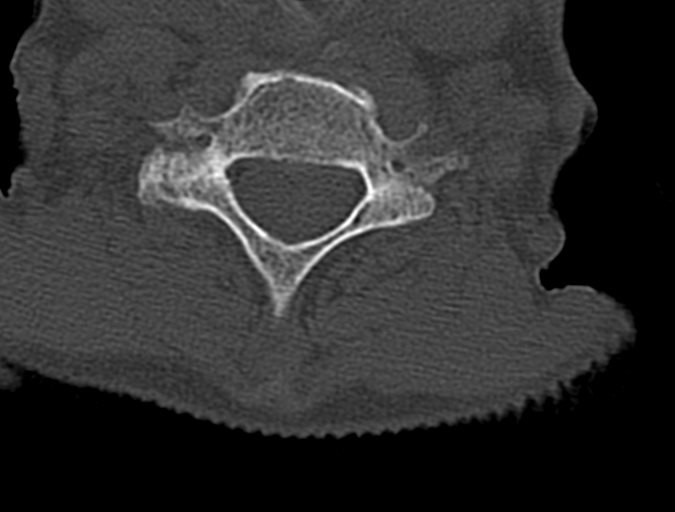
[im 34/84  bone]
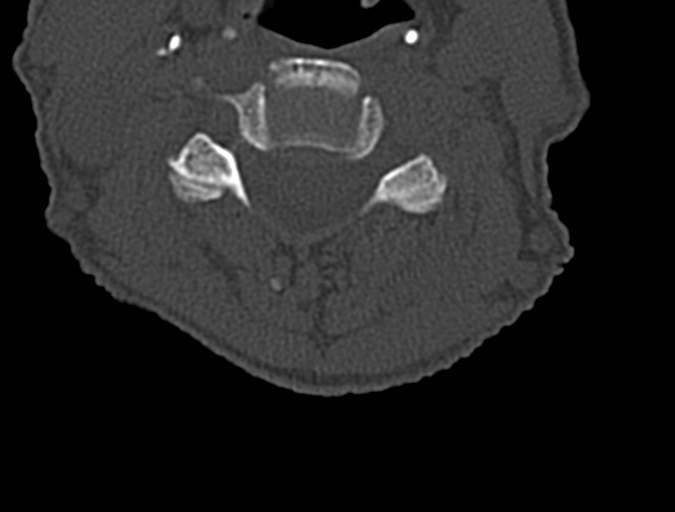
[im 50/84  bone]
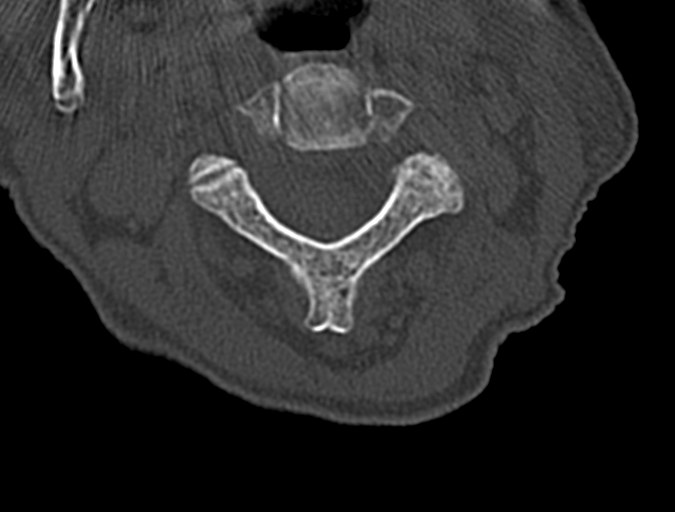
[im 67/84  bone]
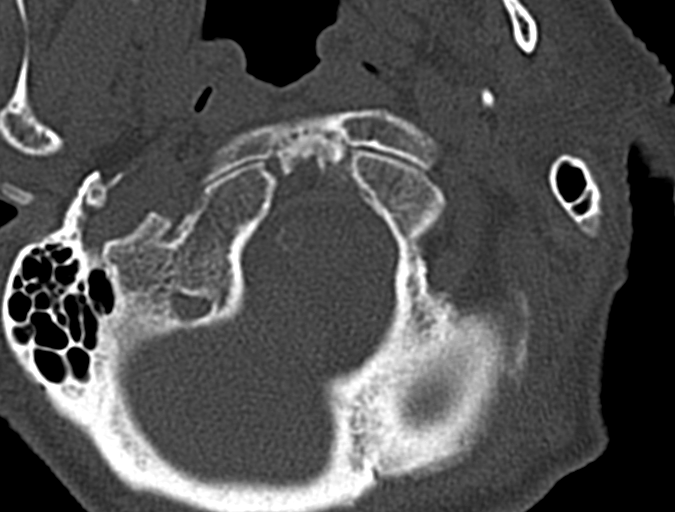

[Series 12: sagittal bone · sagittal · 0.25mm/px · 5 of 61 slices shown]
[im 11/61  bone]
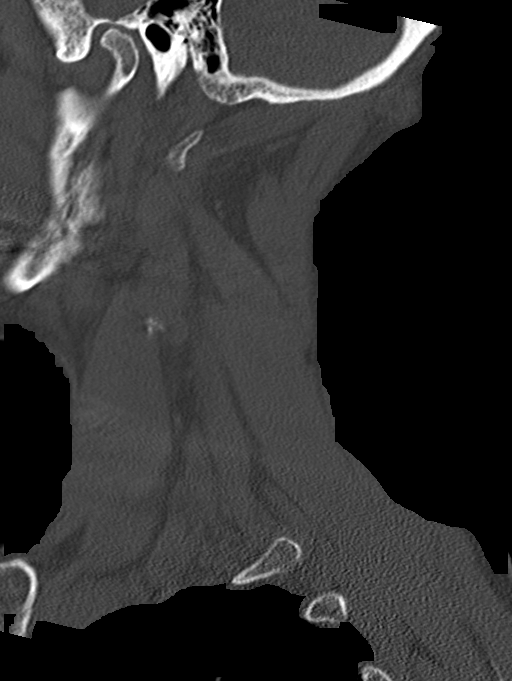
[im 21/61  bone]
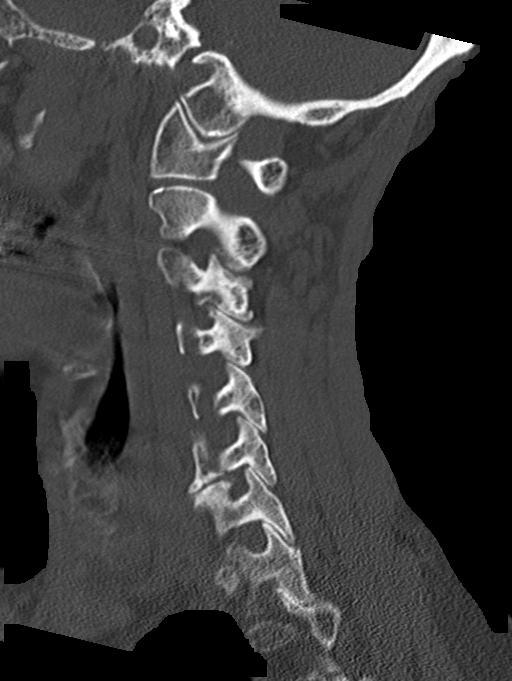
[im 31/61  bone]
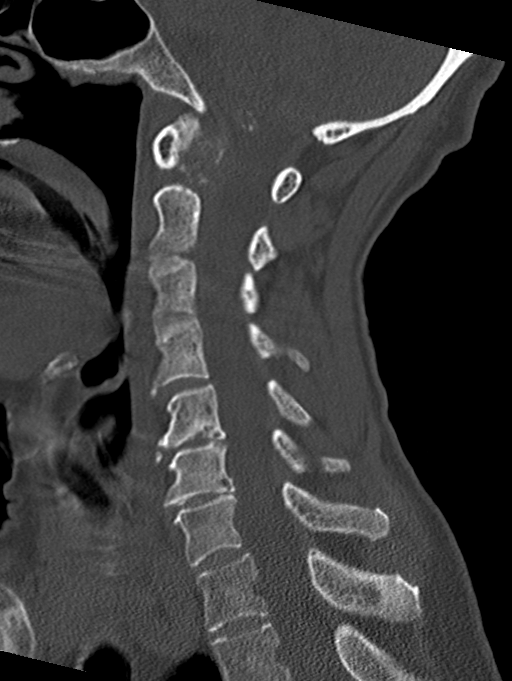
[im 41/61  bone]
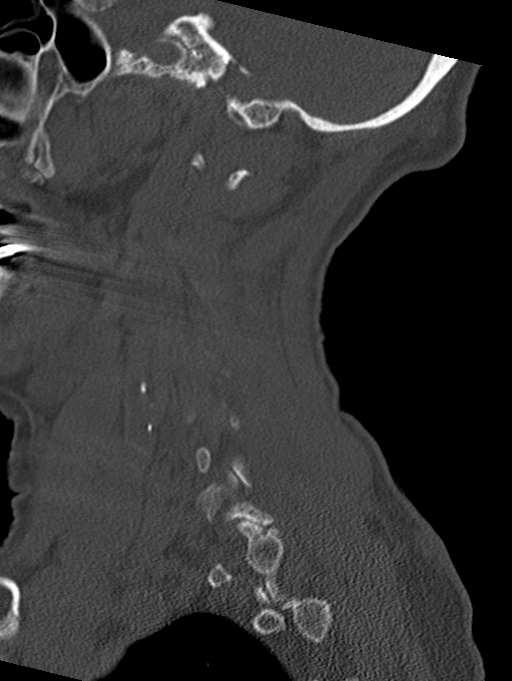
[im 51/61  bone]
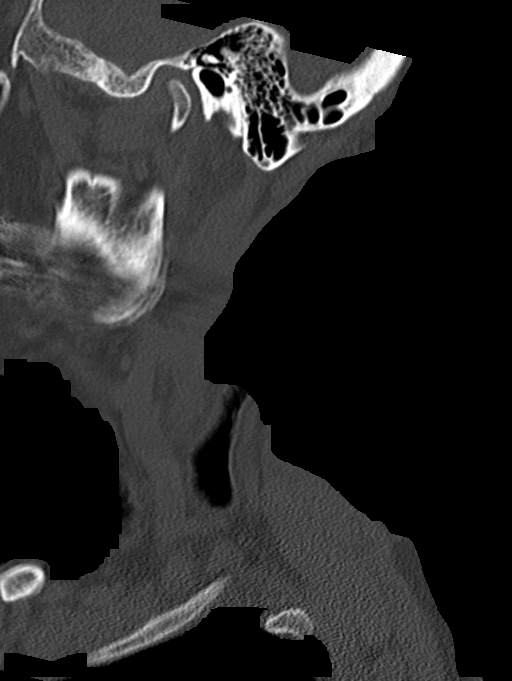

[14 of 33 positions shown; findings below may reference images not displayed]

FINDINGS: CT HEAD FINDINGS

Brain: No evidence of acute infarction, hemorrhage, hydrocephalus,
extra-axial collection or mass lesion/mass effect. Confluent
low-density in the cerebral white matter from chronic small vessel
ischemia. Generalized atrophy.

Vascular: Atherosclerotic calcification

Skull: Negative for fracture. Possible left parietal scalp swelling.

Sinuses/Orbits: Bilateral cataract resection.

CT CERVICAL SPINE FINDINGS

Alignment: Normal

Skull base and vertebrae: Negative for fracture

Soft tissues and spinal canal: No prevertebral fluid or swelling. No
visible canal hematoma.

Disc levels:  Usual degenerative changes

Upper chest: No visible injury
IMPRESSION: No evidence of acute intracranial or cervical spine injury.
